# Patient Record
Sex: Female | Born: 1970 | Race: Black or African American | Hispanic: No | Marital: Married | Smoking: Never smoker
Health system: Southern US, Community
[De-identification: ages and names within clinical notes are randomized; demographics above are authoritative.]

## PROBLEM LIST (undated history)

## (undated) DIAGNOSIS — I10 Essential (primary) hypertension: Secondary | ICD-10-CM

---

## 2021-05-05 ENCOUNTER — Encounter: Payer: Self-pay | Admitting: Emergency Medicine

## 2021-05-05 ENCOUNTER — Emergency Department: Payer: No Typology Code available for payment source

## 2021-05-05 ENCOUNTER — Emergency Department
Admission: EM | Admit: 2021-05-05 | Discharge: 2021-05-05 | Disposition: A | Discharge status: Home / Self Care | Payer: No Typology Code available for payment source | Attending: Emergency Medicine | Admitting: Emergency Medicine

## 2021-05-05 ENCOUNTER — Other Ambulatory Visit: Payer: Self-pay

## 2021-05-05 DIAGNOSIS — R42 Dizziness and giddiness: Secondary | ICD-10-CM | POA: Insufficient documentation

## 2021-05-05 DIAGNOSIS — M25512 Pain in left shoulder: Secondary | ICD-10-CM | POA: Diagnosis present

## 2021-05-05 DIAGNOSIS — M542 Cervicalgia: Secondary | ICD-10-CM | POA: Diagnosis not present

## 2021-05-05 DIAGNOSIS — I10 Essential (primary) hypertension: Secondary | ICD-10-CM | POA: Insufficient documentation

## 2021-05-05 DIAGNOSIS — Y92410 Unspecified street and highway as the place of occurrence of the external cause: Secondary | ICD-10-CM | POA: Diagnosis not present

## 2021-05-05 DIAGNOSIS — M549 Dorsalgia, unspecified: Secondary | ICD-10-CM | POA: Insufficient documentation

## 2021-05-05 HISTORY — DX: Essential (primary) hypertension: I10

## 2021-05-05 MED ORDER — MELOXICAM 15 MG PO TABS: 15.0000 mg | ORAL_TABLET | Freq: Every day | ORAL | 0 refills | Status: AC

## 2021-05-05 MED ORDER — ACETAMINOPHEN 325 MG PO TABS
650.0000 mg | ORAL_TABLET | Freq: Once | ORAL | Status: AC
  Administered 2021-05-05: 650 mg via ORAL
  Filled 2021-05-05: qty 2

## 2021-05-05 MED ORDER — METHOCARBAMOL 750 MG PO TABS: 750.0000 mg | ORAL_TABLET | Freq: Four times a day (QID) | ORAL | 0 refills | Status: AC | PRN

## 2021-05-05 MED ORDER — METHOCARBAMOL 500 MG PO TABS
750.0000 mg | ORAL_TABLET | Freq: Once | ORAL | Status: AC
  Administered 2021-05-05: 750 mg via ORAL
  Filled 2021-05-05: qty 2

## 2021-05-05 MED ORDER — MELOXICAM 7.5 MG PO TABS
15.0000 mg | ORAL_TABLET | Freq: Once | ORAL | Status: AC
  Administered 2021-05-05: 15 mg via ORAL
  Filled 2021-05-05: qty 2

## 2021-05-05 NOTE — Discharge Instructions (Addendum)
Please take the antiinflammatory and muscle relaxant as prescribed. You can also take Tylenol, up to 1000mg  4x daily as needed for pain. Follow up with primary care if symptoms persist or return to ER with any worsening.
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
